# Patient Record
Sex: Male | Born: 1995 | Race: Black or African American | Hispanic: No | Marital: Single | Smoking: Never smoker
Health system: Southern US, Community
[De-identification: ages and names within clinical notes are randomized; demographics above are authoritative.]

---
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
## 2016-10-11 ENCOUNTER — Ambulatory Visit: Payer: Medicaid Other

## 2016-10-12 ENCOUNTER — Encounter: Payer: Self-pay | Admitting: Pediatrics

## 2016-10-12 ENCOUNTER — Ambulatory Visit (INDEPENDENT_AMBULATORY_CARE_PROVIDER_SITE_OTHER): Payer: Medicaid Other | Admitting: Pediatrics

## 2016-10-12 VITALS — Temp 99.2°F | Wt 261.8 lb

## 2016-10-12 DIAGNOSIS — L219 Seborrheic dermatitis, unspecified: Secondary | ICD-10-CM | POA: Insufficient documentation

## 2016-10-12 DIAGNOSIS — R238 Other skin changes: Secondary | ICD-10-CM | POA: Diagnosis not present

## 2016-10-12 MED ORDER — TRIAMCINOLONE ACETONIDE 0.025 % EX OINT: 1.0000 "application " | TOPICAL_OINTMENT | Freq: Two times a day (BID) | CUTANEOUS | 1 refills | Status: AC

## 2016-10-12 MED ORDER — KETOCONAZOLE 2 % EX SHAM: 1.0000 "application " | MEDICATED_SHAMPOO | CUTANEOUS | 12 refills | Status: AC

## 2016-10-12 NOTE — Progress Notes (Signed)
History was provided by the patient.  Steven NewcomerKeyshawn White is a 21 y.o. male who is here for dry skin on face, scalp.     HPI:    Steven GrimKeyshawn is a 21 yo M with history of obesity, sebhorrhea, dry scalp who presents for seborrhea and dry scalp. Symptoms have previously been managed with Ketoconazole shampoo and Triamcinolone cream which have done a good job of controlling his symptoms; however, he has run out of both of these and symptoms are worsening. He is having flaky skin in b/l nasolabial folds and his scalp is very dry and flaky, particularly along the hairline. Symptoms have worsened since the weather got colder. No fevers. No skin drainage. Skin is not itchy. No other skin issues on extremities or trunk.   The following portions of the patient's history were reviewed and updated as appropriate: allergies, current medications, past medical history and problem list.  Physical Exam:  Temp 99.2 F (37.3 C) (Oral)   Wt 261 lb 12.8 oz (118.8 kg)   BMI 40.14 kg/m   No blood pressure reading on file for this encounter. No LMP for male patient.    General:   alert, cooperative and no distress     Skin:   White flaky rash on bilateral nasolabial folds, thickened scaly skin on scalp that is worst in the hairline  Oral cavity:   moist mucous membranes  Eyes:   sclerae White, EOMI  Ears:   normal bilaterally  Nose: not examined  Neck:  Neck appearance: normal, acanthosis nigricans  Lungs:  clear to auscultation bilaterally  Heart:   regular rate and rhythm, S1, S2 normal, no murmur, click, rub or gallop   Abdomen:  not examined  GU:  not examined  Extremities:   extremities normal, atraumatic, no cyanosis or edema  Neuro:  normal without focal findings and mental status, speech normal, alert and oriented x3    Assessment/Plan: 1. Seborrhea - Flaky skin on nasolabial folds consistent with seb derm. Will refill Triamcinolone rx with instructions to use twice daily for up to 1 week. Advised  return to clinic if no improvement or worsening.  - triamcinolone (KENALOG) 0.025 % ointment; Apply 1 application topically 2 (two) times daily. To be used on face for 5-7 days as needed  Dispense: 30 g; Refill: 1  2. Dry scalp - Dry scalp on exam. Will refill ketoconazole shampoo to be used two times per week as patient has run out.  - ketoconazole (NIZORAL) 2 % shampoo; Apply 1 application topically 2 (two) times a week.  Dispense: 120 mL; Refill: 12  Of note, patient requesting work excuse for Wednesday-Friday of last week for stomach pain and concern for flu. As he did not call into clinic or come to clinic, denied note for last week but will provide work excuse for today.   - Immunizations today: none  - [facility] sooner as needed.    Steven Meoeshma Dorothymae Maciver, MD  10/12/16

## 2016-10-22 ENCOUNTER — Ambulatory Visit: Payer: Medicaid Other

## 2023-03-12 ENCOUNTER — Emergency Department (HOSPITAL_BASED_OUTPATIENT_CLINIC_OR_DEPARTMENT_OTHER): Payer: Self-pay

## 2023-03-12 ENCOUNTER — Encounter (HOSPITAL_BASED_OUTPATIENT_CLINIC_OR_DEPARTMENT_OTHER): Payer: Self-pay

## 2023-03-12 ENCOUNTER — Emergency Department (HOSPITAL_BASED_OUTPATIENT_CLINIC_OR_DEPARTMENT_OTHER)
Admission: EM | Admit: 2023-03-12 | Discharge: 2023-03-12 | Disposition: A | Discharge status: Home / Self Care | Payer: Self-pay | Attending: Emergency Medicine | Admitting: Emergency Medicine

## 2023-03-12 ENCOUNTER — Other Ambulatory Visit: Payer: Self-pay

## 2023-03-12 DIAGNOSIS — H53149 Visual discomfort, unspecified: Secondary | ICD-10-CM | POA: Insufficient documentation

## 2023-03-12 DIAGNOSIS — H538 Other visual disturbances: Secondary | ICD-10-CM | POA: Insufficient documentation

## 2023-03-12 DIAGNOSIS — R519 Headache, unspecified: Secondary | ICD-10-CM | POA: Insufficient documentation

## 2023-03-12 LAB — CBC WITH DIFFERENTIAL/PLATELET
Abs Immature Granulocytes: 0.04 10*3/uL (ref 0.00–0.07)
Basophils Absolute: 0.1 10*3/uL (ref 0.0–0.1)
Basophils Relative: 0 %
Eosinophils Absolute: 0.1 10*3/uL (ref 0.0–0.5)
Eosinophils Relative: 1 %
HCT: 41 % (ref 39.0–52.0)
Hemoglobin: 13.8 g/dL (ref 13.0–17.0)
Immature Granulocytes: 0 %
Lymphocytes Relative: 31 %
Lymphs Abs: 4.3 10*3/uL — ABNORMAL HIGH (ref 0.7–4.0)
MCH: 29.1 pg (ref 26.0–34.0)
MCHC: 33.7 g/dL (ref 30.0–36.0)
MCV: 86.3 fL (ref 80.0–100.0)
Monocytes Absolute: 0.7 10*3/uL (ref 0.1–1.0)
Monocytes Relative: 5 %
Neutro Abs: 8.8 10*3/uL — ABNORMAL HIGH (ref 1.7–7.7)
Neutrophils Relative %: 63 %
Platelets: 394 10*3/uL (ref 150–400)
RBC: 4.75 MIL/uL (ref 4.22–5.81)
RDW: 12.8 % (ref 11.5–15.5)
WBC: 14 10*3/uL — ABNORMAL HIGH (ref 4.0–10.5)
nRBC: 0 % (ref 0.0–0.2)

## 2023-03-12 LAB — COMPREHENSIVE METABOLIC PANEL
ALT: 26 U/L (ref 0–44)
AST: 23 U/L (ref 15–41)
Albumin: 3.5 g/dL (ref 3.5–5.0)
Alkaline Phosphatase: 75 U/L (ref 38–126)
Anion gap: 8 (ref 5–15)
BUN: 10 mg/dL (ref 6–20)
CO2: 25 mmol/L (ref 22–32)
Calcium: 8.4 mg/dL — ABNORMAL LOW (ref 8.9–10.3)
Chloride: 104 mmol/L (ref 98–111)
Creatinine, Ser: 0.94 mg/dL (ref 0.61–1.24)
GFR, Estimated: >60 mL/min (ref >60)
Glucose, Bld: 144 mg/dL — ABNORMAL HIGH (ref 70–99)
Potassium: 3.5 mmol/L (ref 3.5–5.1)
Sodium: 137 mmol/L (ref 135–145)
Total Bilirubin: 0.5 mg/dL (ref 0.3–1.2)
Total Protein: 7.6 g/dL (ref 6.5–8.1)

## 2023-03-12 MED ORDER — METOCLOPRAMIDE HCL 10 MG PO TABS: 10.0000 mg | ORAL_TABLET | Freq: Four times a day (QID) | ORAL | 0 refills | Status: AC | PRN

## 2023-03-12 MED ORDER — IOHEXOL 350 MG/ML SOLN
75.0000 mL | Freq: Once | INTRAVENOUS | Status: AC | PRN
  Administered 2023-03-12: 75 mL via INTRAVENOUS

## 2023-03-12 MED ORDER — KETOROLAC TROMETHAMINE 30 MG/ML IJ SOLN: 30.0000 mg | Freq: Once | INTRAMUSCULAR | Status: DC

## 2023-03-12 MED ORDER — METOCLOPRAMIDE HCL 5 MG/ML IJ SOLN
10.0000 mg | Freq: Once | INTRAMUSCULAR | Status: AC
  Administered 2023-03-12: 10 mg via INTRAVENOUS
  Filled 2023-03-12: qty 2

## 2023-03-12 MED ORDER — DIPHENHYDRAMINE HCL 50 MG/ML IJ SOLN
25.0000 mg | Freq: Once | INTRAMUSCULAR | Status: AC
  Administered 2023-03-12: 25 mg via INTRAVENOUS
  Filled 2023-03-12: qty 1

## 2023-03-12 NOTE — ED Triage Notes (Signed)
He stated he had blurred vision for three days. Patient has a sudden onset headache that started one hour ago. He stated he is having light sensitivity. He denied any recent injury.

## 2023-03-12 NOTE — ED Provider Notes (Signed)
[facility] Provider Note   CSN: [id number] Arrival date & time: 03/12/23  1608     History  Chief Complaint  Patient presents with   Headache    Steven White is a 27 y.o. male here presenting with headache.  Patient states that he has been having blurry vision for several days.  He had sudden onset of severe headache today.  Patient states that he has some photophobia.  Denies any neck pain.  Denies any sick contacts.  He states that he never had this kind of headache before.  No meds prior to arrival.  The history is provided by the patient.       Home Medications Prior to Admission medications   Medication Sig Start Date End Date Taking? Authorizing Provider  ketoconazole (NIZORAL) 2 % shampoo Apply 1 application topically 2 (two) times a week. 10/14/16   Minda Meo, MD  triamcinolone (KENALOG) 0.025 % ointment Apply 1 application topically 2 (two) times daily. To be used on face for 5-7 days as needed 10/12/16   Minda Meo, MD      Allergies    Patient has no known allergies.    Review of Systems   Review of Systems  Neurological:  Positive for headaches.  All other systems reviewed and are negative.   Physical Exam Updated Vital Signs BP (!) 151/100 (BP Location: Left Arm)   Pulse (!) 106   Temp 98 F (36.7 C) (Oral)   Resp 18   Ht 5\' 7"  (1.702 m)   Wt 118 kg   SpO2 100%   BMI 40.74 kg/m  Physical Exam Vitals and nursing note reviewed.  Constitutional:      Appearance: He is well-developed.     Comments: Well-appearing and comfortable.  No obvious photophobia on my exam  HENT:     Head: Normocephalic.     Mouth/Throat:     Mouth: Mucous membranes are moist.  Eyes:     Extraocular Movements: Extraocular movements intact.     Pupils: Pupils are equal, round, and reactive to light.  Neck:     Comments: No meningeal sign Cardiovascular:     Rate and Rhythm: Normal rate and regular rhythm.     Heart  sounds: Normal heart sounds.  Pulmonary:     Effort: Pulmonary effort is normal.     Breath sounds: Normal breath sounds.  Abdominal:     General: Bowel sounds are normal.     Palpations: Abdomen is soft.  Musculoskeletal:        General: Normal range of motion.     Cervical back: Normal range of motion and neck supple.  Skin:    General: Skin is warm.  Neurological:     Mental Status: He is alert and oriented to person, place, and time.     Cranial Nerves: No cranial nerve deficit or facial asymmetry.     Sensory: No sensory deficit.     Motor: No weakness.     Comments: No obvious facial droop.  Cranial nerves II to XII intact.  Patient has normal strength and sensation bilateral arms and legs.  Patient has normal gait.  Psychiatric:        Mood and Affect: Mood normal.        Behavior: Behavior normal.     ED Results / Procedures / Treatments   Labs (all labs ordered are listed, but only abnormal results are displayed) Labs Reviewed  CBC WITH  DIFFERENTIAL/PLATELET - Abnormal; Notable for the following components:      Result Value   WBC 14.0 (*)    Neutro Abs 8.8 (*)    Lymphs Abs 4.3 (*)    All other components within normal limits  COMPREHENSIVE METABOLIC PANEL - Abnormal; Notable for the following components:   Glucose, Bld 144 (*)    Calcium 8.4 (*)    All other components within normal limits    EKG None  Radiology CT ANGIO HEAD NECK W WO CM  Result Date: 03/12/2023 CLINICAL DATA:  Headache with blurry vision. Evaluate for dissection. EXAM: CT ANGIOGRAPHY HEAD AND NECK WITH AND WITHOUT CONTRAST TECHNIQUE: Multidetector CT imaging of the head and neck was performed using the standard protocol during bolus administration of intravenous contrast. Multiplanar CT image reconstructions and MIPs were obtained to evaluate the vascular anatomy. Carotid stenosis measurements (when applicable) are obtained utilizing NASCET criteria, using the distal internal carotid  diameter as the denominator. RADIATION DOSE REDUCTION: This exam was performed according to the departmental dose-optimization program which includes automated exposure control, adjustment of the mA and/or kV according to patient size and/or use of iterative reconstruction technique. CONTRAST:  75mL OMNIPAQUE IOHEXOL 350 MG/ML SOLN COMPARISON:  None Available. FINDINGS: CT HEAD FINDINGS Brain: There is no acute intracranial hemorrhage, extra-axial fluid collection, or acute infarct. Parenchymal volume is normal. The ventricles are normal in size. Gray-white differentiation is preserved. The pituitary and suprasellar region are normal. 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 There is no supratentorial mass effect or midline shift. Vascular: See below. Skull: Normal. Negative for fracture or focal lesion. Sinuses/Orbits: The paranasal sinuses are clear. The globes and orbits are unremarkable. Other: The mastoid air cells and middle ear cavities are clear. Review of the MIP images confirms the above findings CTA NECK FINDINGS Evaluation of the vasculature of the head and neck is limited by bolus timing and patient body habitus. Within this confine: Aortic arch: The imaged aortic arch is normal. The origins of the major branch vessels are patent. The subclavian arteries are patent to the level imaged. Right carotid system: The right common, internal, and external carotid arteries are patent, without hemodynamically significant stenosis or occlusion there is no definite evidence of dissection or aneurysm. Left carotid system: The left common, internal, and external carotid arteries are patent, without hemodynamically significant stenosis or occlusion there is no definite evidence of dissection or aneurysm. Vertebral arteries: The proximal vertebral arteries are not well assessed. The vertebral arteries beyond the mid V2 segments appear patent,  without definite evidence of hemodynamically significant stenosis, occlusion, dissection, or aneurysm. Skeleton: There is no acute osseous abnormality or suspicious osseous lesion. There is no definite canal hematoma. Other neck: The soft tissues of the neck are unremarkable. Upper chest: The imaged lung apices are clear. Review of the MIP images confirms the above findings CTA HEAD FINDINGS Anterior circulation: The intracranial ICAs appear normal. The bilateral MCAs and ACAS appear normal, without definite stenosis or occlusion. There is no definite aneurysm or AVM. Posterior circulation: The bilateral V4 segments are patent. The basilar artery is patent. The cerebellar arteries are not well seen. The bilateral PCAs appear patent, without definite stenosis or occlusion. There is no definite aneurysm or AVM. Venous sinuses: Patent. Anatomic variants: None. Review of the MIP images confirms the above findings IMPRESSION: 1. No acute intracranial pathology on initial noncontrast head CT. 2. Limited  evaluation of the vasculature of the head and neck due to bolus timing and patient body habitus. Within this confine, no definite evidence of dissection or other significant vascular finding. If there is persistent clinical concern, consider MRA. Electronically Signed   By: Lesia Hausen M.D.   On: 03/12/2023 18:08    Procedures Procedures    Medications Ordered in ED Medications  metoCLOPramide (REGLAN) injection 10 mg (10 mg Intravenous Given 03/12/23 1812)  diphenhydrAMINE (BENADRYL) injection 25 mg (25 mg Intravenous Given 03/12/23 1811)  iohexol (OMNIPAQUE) 350 MG/ML injection 75 mL (75 mLs Intravenous Contrast Given 03/12/23 1726)    ED Course/ Medical Decision Making/ A&P                                 Medical Decision Making Steven White is a 27 y.o. male here presenting with headache.  Patient had sudden onset headache today.  Concern for possible subarachnoid hemorrhage versus dissection.  Will  get a CTA head and neck.  Will give migraine cocktail  6:21 PM I reviewed patient's labs and independently interpreted CT scan.  White blood cell count is 14 but patient has no meningeal signs to suggest meningitis.  CTA head and neck showed no obvious dissection.  Patient felt better with migraine cocktail.  Will discharge home and have him follow-up with ophthalmology and neurology   Problems Addressed: Blurry vision: acute illness or injury Nonintractable headache, unspecified chronicity pattern, unspecified headache type: acute illness or injury  Amount and/or Complexity of Data Reviewed Labs: ordered. Decision-making details documented in ED Course. Radiology: ordered and independent interpretation performed. Decision-making details documented in ED Course.  Risk Prescription drug management.    Final Clinical Impression(s) / ED Diagnoses Final diagnoses:  None    Rx / DC Orders ED Discharge Orders     None         Charlynne Pander, MD 03/12/23 Rickey Primus

## 2023-03-12 NOTE — Discharge Instructions (Addendum)
As we discussed your CT scan was unremarkable today.  You likely have migraines.  Take Tylenol or Motrin for headache.  Take Reglan given for severe headaches  You need to see an eye doctor for full evaluation and if you have persistent headache referred you to a neurologist  Return to ER if you have worse headaches, vomiting
